# Patient Record
Sex: Female | Born: 2000 | Race: White | Hispanic: No | Marital: Single | Smoking: Never smoker
Health system: Southern US, Community
[De-identification: ages and names within clinical notes are randomized; demographics above are authoritative.]

## PROBLEM LIST (undated history)

## (undated) DIAGNOSIS — F329 Major depressive disorder, single episode, unspecified: Secondary | ICD-10-CM

## (undated) DIAGNOSIS — H539 Unspecified visual disturbance: Secondary | ICD-10-CM

---
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
## 2024-03-05 ENCOUNTER — Telehealth: Admitting: Physician Assistant

## 2024-03-05 DIAGNOSIS — J069 Acute upper respiratory infection, unspecified: Secondary | ICD-10-CM | POA: Diagnosis not present

## 2024-03-05 NOTE — Patient Instructions (Signed)
  Cathe Clore, thank you for joining [name], PA-C for today's virtual visit.  While this provider is not your primary care provider (PCP), if your PCP is located in our provider database this encounter information will be shared with them immediately following your visit.   A [facility] MyChart account gives you access to today's visit and all your visits, tests, and labs performed at [facility] " click here if you don't have a [facility] MyChart account or go to [email or web address]  Consent: (Patient) Leah Wilkerson provided verbal consent for this virtual visit at the beginning of the encounter.  Current Medications: No current outpatient medications on file.   Medications ordered in this encounter:  No orders of the defined types were placed in this encounter.    *If you need refills on other medications prior to your next appointment, please contact your pharmacy*  Follow-Up: Call back or seek an in-person evaluation if the symptoms worsen or if the condition fails to improve as anticipated.  [facility] [phone]  Other Instructions Please report to the nearest Emergency room with any worsening symptoms. Follow up with primary care provider (PCP) in 2 -3 days.    If you have been instructed to have an in-person evaluation today at a local Urgent Care facility, please use the link below. It will take you to a list of all of our available [facility], including address, phone number and hours of operation. Please do not delay care.  [facility]  If you or a family member do not have a primary care provider, use the link below to schedule a visit and establish care. When you choose a [facility] primary care physician or advanced practice provider, you gain a long-term partner in health. Find a Primary Care Provider  Learn more about [facility]'s in-office and virtual care options: [facility] - Get Care  Now

## 2024-03-05 NOTE — Progress Notes (Signed)
 Virtual Visit Consent   Leah Wilkerson, you are scheduled for a virtual visit with a [facility] provider today. Just as with appointments in the office, your consent must be obtained to participate. Your consent will be active for this visit and any virtual visit you may have with one of our providers in the next 365 days. If you have a MyChart account, a copy of this consent can be sent to you electronically.  As this is a virtual visit, video technology does not allow for your provider to perform a traditional examination. This may limit your provider's ability to fully assess your condition. If your provider identifies any concerns that need to be evaluated in person or the need to arrange testing (such as labs, EKG, etc.), we will make arrangements to do so. Although advances in technology are sophisticated, we cannot ensure that it will always work on either your end or our end. If the connection with a video visit is poor, the visit may have to be switched to a telephone visit. With either a video or telephone visit, we are not always able to ensure that we have a secure connection.  By engaging in this virtual visit, you consent to the provision of healthcare and authorize for your insurance to be billed (if applicable) for the services provided during this visit. Depending on your insurance coverage, you may receive a charge related to this service.  I need to obtain your verbal consent now. Are you willing to proceed with your visit today? Leah Wilkerson has provided verbal consent on 03/05/2024 for a virtual visit (video or telephone). Leah Wilkerson, [place]  Date: 03/05/2024 3:18 PM   Virtual Visit via Video Note   I, Leah Wilkerson, connected with  Leah Wilkerson  ([id number], 24-Jun-2001) on 03/05/24 at  3:15 PM EDT by a video-enabled telemedicine application and verified that I am speaking with the correct person using two identifiers.  Location: Patient: Virtual Visit Location Patient:  Home Provider: Virtual Visit Location Provider: Home Office   I discussed the limitations of evaluation and management by telemedicine and the availability of in person appointments. The patient expressed understanding and agreed to proceed.    History of Present Illness: Leah Wilkerson is a 23 y.o. who identifies as a female who was assigned female at birth, and is being seen today for URI.  HPI: URI  This is a new problem. The current episode started today. There has been no fever. Associated symptoms include a sore throat. She has tried nothing for the symptoms. The treatment provided no relief.    Problems:  Patient Active Problem List   Diagnosis Date Noted   Oppositional defiant disorder 12/19/2015   Depressive disorder 12/19/2015    Allergies: No Known Allergies Medications: No current outpatient medications on file.  Observations/Objective: Patient is well-developed, well-nourished in no acute distress.  Resting comfortably  at home.  Head is normocephalic, atraumatic.  No labored breathing. Speech is clear and coherent with logical content.  Patient is alert and oriented at baseline.    Assessment and Plan: 1. Upper respiratory tract infection, unspecified type (Primary)  Patients present symptoms suspicious for  URI. Differentials include allergic rhinitis,  bacterial pneumonia, sinusitis. Do not suspect underlying cardiopulmonary process. I considered, but think unlikely, dangerous causes of this patient's symptoms to include ACS, CHF or COPD exacerbations, pneumonia, pneumothorax. Patient is nontoxic appearing and not in need of emergent medical intervention.  Plan: reassurance, reassessment, over the counter  medications, discharge with PCP follow-up  Patient requested note however mychart is not setup. Note not sent at this time. I called - no answer.  Follow Up Instructions: I discussed the assessment and treatment plan with the patient. The patient was provided  an opportunity to ask questions and all were answered. The patient agreed with the plan and demonstrated an understanding of the instructions.  A copy of instructions were sent to the patient via MyChart unless otherwise noted below.    The patient was advised to call back or seek an in-person evaluation if the symptoms worsen or if the condition fails to improve as anticipated.    Leah Settle, PA-C
